# Patient Record
Sex: Female | Born: 1965 | Race: White | Hispanic: No | Marital: Married | Smoking: Former smoker
Health system: Southern US, Community
[De-identification: ages and names within clinical notes are randomized; demographics above are authoritative.]

## PROBLEM LIST (undated history)

## (undated) DIAGNOSIS — E079 Disorder of thyroid, unspecified: Secondary | ICD-10-CM

## (undated) DIAGNOSIS — I82409 Acute embolism and thrombosis of unspecified deep veins of unspecified lower extremity: Secondary | ICD-10-CM

## (undated) HISTORY — PX: BACK SURGERY: SHX140

## (undated) HISTORY — PX: KNEE SURGERY: SHX244

---

## 1991-09-18 DIAGNOSIS — M810 Age-related osteoporosis without current pathological fracture: Secondary | ICD-10-CM | POA: Insufficient documentation

## 1991-09-18 DIAGNOSIS — E039 Hypothyroidism, unspecified: Secondary | ICD-10-CM | POA: Insufficient documentation

## 2005-10-22 ENCOUNTER — Emergency Department (HOSPITAL_COMMUNITY): Admission: EM | Admit: 2005-10-22 | Discharge: 2005-10-22 | Payer: Self-pay | Admitting: Emergency Medicine

## 2007-06-10 ENCOUNTER — Ambulatory Visit (HOSPITAL_COMMUNITY): Admission: RE | Admit: 2007-06-10 | Discharge: 2007-06-10 | Payer: Self-pay | Admitting: Obstetrics and Gynecology

## 2008-04-24 ENCOUNTER — Emergency Department (HOSPITAL_COMMUNITY): Admission: EM | Admit: 2008-04-24 | Discharge: 2008-04-24 | Payer: Self-pay | Admitting: Emergency Medicine

## 2009-07-29 ENCOUNTER — Emergency Department (HOSPITAL_COMMUNITY): Admission: EM | Admit: 2009-07-29 | Discharge: 2009-07-29 | Payer: Self-pay | Admitting: Emergency Medicine

## 2009-07-31 ENCOUNTER — Emergency Department (HOSPITAL_COMMUNITY): Admission: EM | Admit: 2009-07-31 | Discharge: 2009-07-31 | Payer: Self-pay | Admitting: Emergency Medicine

## 2009-09-17 DIAGNOSIS — E8881 Metabolic syndrome: Secondary | ICD-10-CM | POA: Insufficient documentation

## 2010-05-23 ENCOUNTER — Encounter: Admission: RE | Admit: 2010-05-23 | Discharge: 2010-05-23 | Payer: Self-pay | Admitting: Oncology

## 2010-09-07 ENCOUNTER — Ambulatory Visit: Payer: Self-pay | Admitting: Oncology

## 2010-09-12 LAB — PTH, INTACT AND CALCIUM
Calcium, Total (PTH): 10 mg/dL (ref 8.4–10.5)
PTH: 26.9 pg/mL (ref 14.0–72.0)

## 2010-09-12 LAB — COMPREHENSIVE METABOLIC PANEL
Albumin: 4.4 g/dL (ref 3.5–5.2)
Alkaline Phosphatase: 68 U/L (ref 39–117)
BUN: 15 mg/dL (ref 6–23)
CO2: 29 mEq/L (ref 19–32)
Glucose, Bld: 93 mg/dL (ref 70–99)
Potassium: 4.2 mEq/L (ref 3.5–5.3)
Sodium: 142 mEq/L (ref 135–145)
Total Bilirubin: 0.9 mg/dL (ref 0.3–1.2)
Total Protein: 6.6 g/dL (ref 6.0–8.3)

## 2010-09-12 LAB — TSH: TSH: 0.09 u[IU]/mL — ABNORMAL LOW (ref 0.350–4.500)

## 2011-07-27 ENCOUNTER — Encounter: Payer: Self-pay | Admitting: Oncology

## 2011-07-27 ENCOUNTER — Other Ambulatory Visit: Payer: Self-pay | Admitting: Oncology

## 2011-07-27 ENCOUNTER — Ambulatory Visit (HOSPITAL_COMMUNITY)
Admission: RE | Admit: 2011-07-27 | Discharge: 2011-07-27 | Disposition: A | Discharge status: Home / Self Care | Payer: 59 | Source: Ambulatory Visit | Attending: Oncology | Admitting: Oncology

## 2011-07-27 DIAGNOSIS — M79609 Pain in unspecified limb: Secondary | ICD-10-CM | POA: Insufficient documentation

## 2011-07-27 DIAGNOSIS — T1490XA Injury, unspecified, initial encounter: Secondary | ICD-10-CM

## 2011-07-27 DIAGNOSIS — S92309A Fracture of unspecified metatarsal bone(s), unspecified foot, initial encounter for closed fracture: Secondary | ICD-10-CM | POA: Insufficient documentation

## 2011-07-27 DIAGNOSIS — W19XXXA Unspecified fall, initial encounter: Secondary | ICD-10-CM | POA: Insufficient documentation

## 2011-10-01 ENCOUNTER — Emergency Department (HOSPITAL_COMMUNITY)
Admission: EM | Admit: 2011-10-01 | Discharge: 2011-10-01 | Disposition: A | Discharge status: Home / Self Care | Payer: 59 | Attending: Emergency Medicine | Admitting: Emergency Medicine

## 2011-10-01 ENCOUNTER — Emergency Department (HOSPITAL_COMMUNITY): Payer: 59

## 2011-10-01 ENCOUNTER — Encounter (HOSPITAL_COMMUNITY): Payer: Self-pay | Admitting: Emergency Medicine

## 2011-10-01 DIAGNOSIS — S8290XD Unspecified fracture of unspecified lower leg, subsequent encounter for closed fracture with routine healing: Secondary | ICD-10-CM | POA: Insufficient documentation

## 2011-10-01 DIAGNOSIS — Z4689 Encounter for fitting and adjustment of other specified devices: Secondary | ICD-10-CM

## 2011-10-01 DIAGNOSIS — Z4789 Encounter for other orthopedic aftercare: Secondary | ICD-10-CM | POA: Insufficient documentation

## 2011-10-01 DIAGNOSIS — S92301G Fracture of unspecified metatarsal bone(s), right foot, subsequent encounter for fracture with delayed healing: Secondary | ICD-10-CM

## 2011-10-01 DIAGNOSIS — M79609 Pain in unspecified limb: Secondary | ICD-10-CM | POA: Insufficient documentation

## 2011-10-01 HISTORY — DX: Disorder of thyroid, unspecified: E07.9

## 2011-10-01 HISTORY — DX: Acute embolism and thrombosis of unspecified deep veins of unspecified lower extremity: I82.409

## 2011-10-01 NOTE — ED Provider Notes (Signed)
History     CSN: [id number]  Arrival date & time 10/01/11  2018   First MD Initiated Contact with Patient 10/01/11 2018      Chief Complaint  Patient presents with  . cast off     (Consider location/radiation/quality/duration/timing/severity/associated sxs/prior treatment) HPI Comments: Patient here for removal of cast - sees Dr. Rushie Goltz, an orthopedic surgeon in [place].  Has fracture of 5th metatarsal - is to get x-rays after the cast is removed and then placed in CAM walker that was sent to her per the orthopedist.  Patient is a 46 y.o. female presenting with lower extremity pain. The history is provided by the patient. No language interpreter was used.  Foot Pain This is a new problem. The current episode started more than 1 month ago. The problem occurs constantly. The problem has been unchanged. Associated symptoms include arthralgias. Pertinent negatives include no abdominal pain, anorexia, chest pain, congestion, coughing, diaphoresis, fatigue, fever, headaches, myalgias, nausea, numbness, rash, swollen glands, vertigo, vomiting or weakness. The symptoms are aggravated by nothing. She has tried nothing for the symptoms. The treatment provided no relief.    Past Medical History  Diagnosis Date  . Thyroid disease   . DVT (deep venous thrombosis)   . Osteoporosis     Past Surgical History  Procedure Date  . Back surgery   . Knee surgery     History reviewed. No pertinent family history.  History  Substance Use Topics  . Smoking status: Never Smoker   . Smokeless tobacco: Never Used  . Alcohol Use: Yes     once a week wine    OB History    Grav Para Term Preterm Abortions TAB SAB Ect Mult Living                  Review of Systems  Constitutional: Negative for fever, diaphoresis and fatigue.  HENT: Negative for congestion.   Respiratory: Negative for cough.   Cardiovascular: Negative for chest pain.  Gastrointestinal: Negative for nausea, vomiting, abdominal  pain and anorexia.  Musculoskeletal: Positive for arthralgias. Negative for myalgias.  Skin: Negative for rash.  Neurological: Negative for vertigo, weakness, numbness and headaches.  All other systems reviewed and are negative.    Allergies  Review of patient's allergies indicates no known allergies.  Home Medications  No current outpatient prescriptions on file.  BP 119/73  Pulse 98  Temp(Src) 97.8 F (36.6 C) (Oral)  Resp 20  SpO2 100%  LMP 09/25/2011  Physical Exam  Nursing note and vitals reviewed. Constitutional: She is oriented to person, place, and time. She appears well-developed and well-nourished. No distress.  HENT:  Head: Normocephalic and atraumatic.  Right Ear: External ear normal.  Left Ear: External ear normal.  Nose: Nose normal.  Mouth/Throat: Oropharynx is clear and moist. No oropharyngeal exudate.  Eyes: Conjunctivae are normal. Pupils are equal, round, and reactive to light. No scleral icterus.  Neck: Normal range of motion. Neck supple.  Cardiovascular: Normal rate and regular rhythm.  Exam reveals no gallop and no friction rub.   No murmur heard. Pulmonary/Chest: Effort normal and breath sounds normal.  Abdominal: Soft. Bowel sounds are normal. She exhibits no distension. There is no tenderness.  Musculoskeletal:       Feet:  Neurological: She is alert and oriented to person, place, and time. No cranial nerve deficit.  Skin: Skin is warm and dry.  Psychiatric: She has a normal mood and affect. Her behavior is normal. Judgment and  thought content normal.    ED Course  Procedures (including critical care time)  Labs Reviewed - No data to display No results found.   Cast removal Healing 5th metatarsal fx   MDM  Cast removed per orthotech - healing 5th metatarsal fx - placed in CAM walker (aircast) and discharged on crutches to follow up with Dr. Audree Bane in [place].        Izola Price [place], [place] 10/01/11 2150

## 2011-10-01 NOTE — ED Notes (Signed)
Per pa.  She is discharging them.  Informed by pa that i needed not to do anything further

## 2011-10-01 NOTE — ED Notes (Signed)
Patient is here to get the cast off.

## 2011-10-01 NOTE — ED Notes (Signed)
Patient discharged by PA.

## 2011-10-01 NOTE — ED Notes (Signed)
JYN:[id number]<AO> Expected date:10/01/11<BR> Expected time: 7:51 PM<BR> Means of arrival:Other<BR> Comments:<BR> Hold for Chanetta Marshall

## 2011-10-02 NOTE — ED Provider Notes (Signed)
Medical screening examination/treatment/procedure(s) were performed by non-physician practitioner and as supervising physician I was immediately available for consultation/collaboration. Devoria Albe, MD, FACEP   Ward Givens, MD 10/02/11 [phone]

## 2011-10-31 ENCOUNTER — Other Ambulatory Visit: Payer: Self-pay | Admitting: Oncology

## 2012-04-01 ENCOUNTER — Ambulatory Visit: Payer: 59 | Admitting: Pharmacist

## 2012-04-02 ENCOUNTER — Ambulatory Visit (INDEPENDENT_AMBULATORY_CARE_PROVIDER_SITE_OTHER): Payer: Self-pay | Admitting: Pharmacist

## 2012-04-02 ENCOUNTER — Encounter: Payer: Self-pay | Admitting: Pharmacist

## 2012-04-02 VITALS — Ht 63.0 in | Wt 148.8 lb

## 2012-04-02 DIAGNOSIS — M810 Age-related osteoporosis without current pathological fracture: Secondary | ICD-10-CM

## 2012-04-02 DIAGNOSIS — E8881 Metabolic syndrome: Secondary | ICD-10-CM

## 2012-04-02 DIAGNOSIS — E039 Hypothyroidism, unspecified: Secondary | ICD-10-CM

## 2012-04-02 NOTE — Progress Notes (Signed)
  Subjective:    Patient ID: Chelsea Carey, female    DOB: 1966-02-05, 46 y.o.   MRN: [id number]  HPI  Patient arrives in good spirits for medication review.  Reports seeing [place] Providers:  Derrek Otho Ket, internist,  Mayra Neer  orthopedic surgeon.  Reports being diagnosed with Osteoporosis for many years and states she is about to finish [employer].   She has a post-forteo plan in place with her [place] endocrinologist.    She was asked twice about supplements including vitamins and did NOT think it necessary to list Vitamin D as a supplement in her medication list.   She does take Vitamin D supplementation for low vitamin D and bone loss.  She is critical of [facility] health care and she is "NOT interested in seeing [facility] providers".   Review of Systems     Objective:   Physical Exam        Assessment & Plan:  Following medication review, no suggestions for change.  It is unlikely she will require additional medication review next year as the Forteo course will be completed in 4 more months of Tx.   She has a follow up plan in place with her endocrinologist.   She does take Vitamin D supplementation for low vitamin D and bone loss.  Complete medication list provided to patient.  Total time in face to face medication review: 23 minutes.

## 2012-04-02 NOTE — Patient Instructions (Signed)
No changes suggested - Thanks for coming in today.

## 2012-04-02 NOTE — Assessment & Plan Note (Signed)
Following medication review, no suggestions for change.  It is unlikely she will require additional medication review next year as the Forteo course will be completed in 4 more months of Tx.   She has a follow up plan in place with her endocrinologist.   She does take Vitamin D supplementation for low vitamin D and bone loss.  Complete medication list provided to patient.  Total time in face to face medication review: 23 minutes.

## 2012-04-03 NOTE — Progress Notes (Signed)
Patient ID: Chelsea Carey, female   DOB: 1966-02-16, 46 y.o.   MRN: [id number] Reviewed and agree with Dr. Macky Lower management.

## 2013-04-17 IMAGING — CR DG FOOT 2V*R*
2 series · 2 of 2 positions shown · non-contrast
Comparison: 07/27/2011.

CLINICAL DATA: Fifth metatarsal fracture.

RIGHT FOOT - 2 VIEW

[x foot ap right]
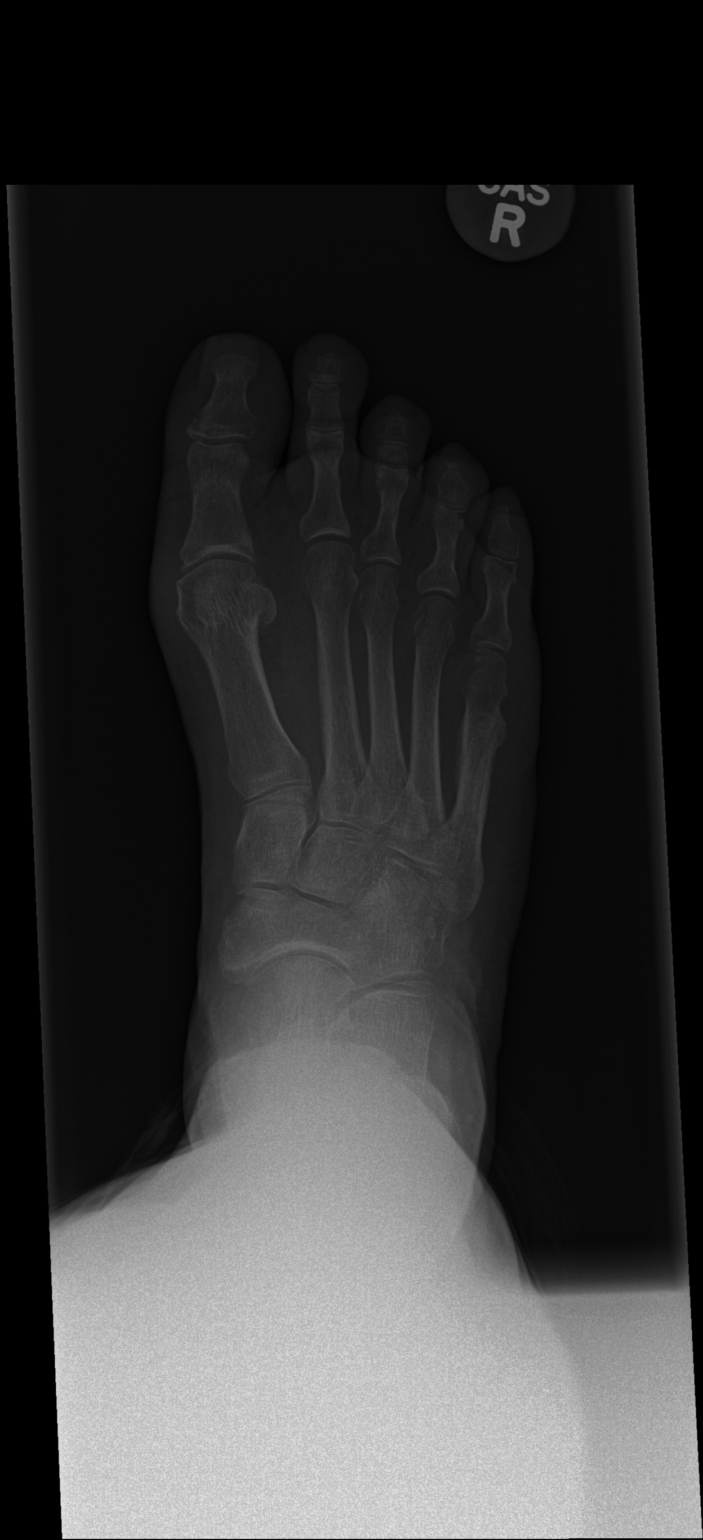

[x foot lat right]
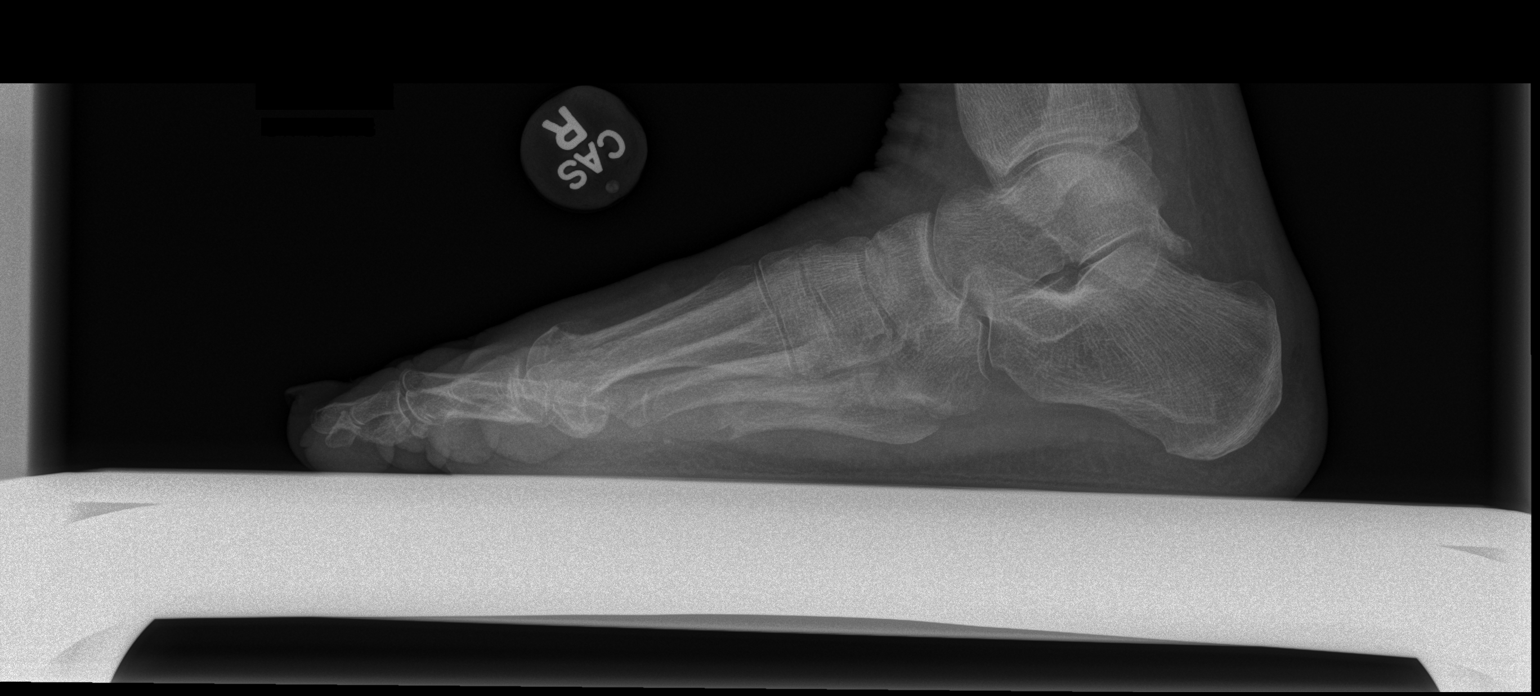

[2 of 2 positions shown; findings below may reference images not displayed]

FINDINGS: The fifth metatarsal fracture demonstrates interval
healing changes with bony ingrowth and callus formation.
IMPRESSION: Healing fifth metatarsal fracture with bony ingrowth and callus
formation.

## 2020-12-26 ENCOUNTER — Other Ambulatory Visit: Payer: Self-pay

## 2020-12-26 ENCOUNTER — Ambulatory Visit (INDEPENDENT_AMBULATORY_CARE_PROVIDER_SITE_OTHER): Payer: Self-pay | Admitting: Plastic Surgery

## 2020-12-26 ENCOUNTER — Encounter: Payer: Self-pay | Admitting: Plastic Surgery

## 2020-12-26 DIAGNOSIS — Z719 Counseling, unspecified: Secondary | ICD-10-CM | POA: Insufficient documentation

## 2020-12-26 NOTE — Progress Notes (Signed)
Botulinum Toxin and Filler Injection Procedure Note  Procedure: Cosmetic botulinum toxin and Filler administration  Pre-operative Diagnosis: Dynamic rhytides and midface volume loss  Post-operative Diagnosis: Same  Complications:  None  Brief history: The patient desires botulinum toxin injection of her forehead. I discussed with the patient this proposed procedure of botulinum toxin injections, which is customized depending on the particular needs of the patient. It is performed on facial rhytids as a temporary correction. The alternatives were discussed with the patient. The risks were addressed including bleeding, scarring, infection, damage to deeper structures, asymmetry, and chronic pain, which may occur infrequently after a procedure. The individual's choice to undergo a surgical procedure is based on the comparison of risks to potential benefits. Other risks include unsatisfactory results, brow ptosis, eyelid ptosis, allergic reaction, temporary paralysis, which should go away with time, bruising, blurring disturbances and delayed healing. Botulinum toxin injections do not arrest the aging process or produce permanent tightening of the eyelid.  Operative intervention maybe necessary to maintain the results of a blepharoplasty or botulinum toxin. The patient understands and wishes to proceed.  Procedure: The area was prepped with alcohol and dried with a clean gauze. Using a clean technique, the botulinum toxin was diluted with 1.25 cc of preservative-free normal saline which was slowly injected with an 18 gauge needle in a tuberculin syringes.  A 32 gauge needles were then used to inject the botulinum toxin. This mixture allow for an aliquot of 5 units per 0.1 cc in each injection site.    Subsequently the mixture was injected in the glabellar and forehead area with preservation of the temporal branch to the lateral eyebrow as well as into each lateral canthal area beginning from the lateral  orbital rim medial to the zygomaticus major in 3 separate areas. A total of 15 Units of botulinum toxin was used. The forehead and glabellar area was injected with care to inject intramuscular only while holding pressure on the supratrochlear vessels in each area during each injection on either side of the medial corrugators. The injection proceeded vertically superiorly to the medial 2/3 of the frontalis muscle and superior 2/3 of the lateral frontalis, again with preservation of the frontal branch.  The midface area was injected at the 3 sub-regions of the mid-face: zygomaticomalar region, anteromedial cheek region, and submalar region for a total of one syringe total. The technique used was serial puncture with equal injections in the 3 sub-regions: the zygomaticomalar region, the anteromedial cheek, and the submalar region.  No complications were noted. Light pressure was held for 5 minutes. She was instructed explicitly in post-operative care.  Botox LOT:  [id number] EXP:  6/24  Restylane Lyft LOT: [id number] EXP: 2023-02-15  Preoperative Dx: Hyperpigmentation of face  Postoperative Dx:  same  Procedure: laser to face  Anesthesia: none  Description of Procedure:  Risks and complications were explained to the patient. Consent was confirmed and signed. Time out was called and all information was confirmed to be correct. The area  area was prepped with alcohol and wiped dry. The IPL laser was set at 7.2 J/cm2. The face was lasered. The patient tolerated the procedure well and there were no complications. The patient is to follow up in 4 weeks.
